# Patient Record
Sex: Male | Born: 1970 | Race: White | Hispanic: No | Marital: Single | Smoking: Never smoker
Health system: Southern US, Community
[De-identification: ages and names within clinical notes are randomized; demographics above are authoritative.]

## PROBLEM LIST (undated history)

## (undated) DIAGNOSIS — E7800 Pure hypercholesterolemia, unspecified: Secondary | ICD-10-CM

## (undated) DIAGNOSIS — I10 Essential (primary) hypertension: Secondary | ICD-10-CM

## (undated) HISTORY — PX: SKIN GRAFT: SHX250

---

## 2008-11-23 ENCOUNTER — Encounter: Admission: RE | Admit: 2008-11-23 | Discharge: 2008-11-23 | Payer: Self-pay | Admitting: Occupational Medicine

## 2009-04-15 ENCOUNTER — Emergency Department (HOSPITAL_COMMUNITY): Admission: EM | Admit: 2009-04-15 | Discharge: 2009-04-15 | Payer: Self-pay | Admitting: Emergency Medicine

## 2010-06-03 LAB — BASIC METABOLIC PANEL
Calcium: 9.3 mg/dL (ref 8.4–10.5)
Creatinine, Ser: 1.24 mg/dL (ref 0.4–1.5)
GFR calc non Af Amer: >60 mL/min (ref >60)
Glucose, Bld: 191 mg/dL — ABNORMAL HIGH (ref 70–99)
Sodium: 136 mEq/L (ref 135–145)

## 2010-06-03 LAB — DIFFERENTIAL
Basophils Relative: 0 % (ref 0–1)
Eosinophils Absolute: 0 10*3/uL (ref 0.0–0.7)
Lymphocytes Relative: 3 % — ABNORMAL LOW (ref 12–46)
Lymphs Abs: 0.3 10*3/uL — ABNORMAL LOW (ref 0.7–4.0)
Monocytes Relative: 3 % (ref 3–12)
Neutro Abs: 10.7 10*3/uL — ABNORMAL HIGH (ref 1.7–7.7)

## 2010-06-03 LAB — CBC
HCT: 44.9 % (ref 39.0–52.0)
Platelets: 128 10*3/uL — ABNORMAL LOW (ref 150–400)
WBC: 11.3 10*3/uL — ABNORMAL HIGH (ref 4.0–10.5)

## 2015-01-15 ENCOUNTER — Other Ambulatory Visit: Payer: Self-pay | Admitting: Physician Assistant

## 2015-01-15 DIAGNOSIS — R1084 Generalized abdominal pain: Secondary | ICD-10-CM

## 2015-01-22 ENCOUNTER — Ambulatory Visit
Admission: RE | Admit: 2015-01-22 | Discharge: 2015-01-22 | Disposition: A | Discharge status: Home / Self Care | Payer: BLUE CROSS/BLUE SHIELD | Source: Ambulatory Visit | Attending: Physician Assistant | Admitting: Physician Assistant

## 2015-01-22 DIAGNOSIS — R1084 Generalized abdominal pain: Secondary | ICD-10-CM

## 2019-08-24 ENCOUNTER — Other Ambulatory Visit: Payer: Self-pay

## 2019-08-24 ENCOUNTER — Ambulatory Visit: Payer: BLUE CROSS/BLUE SHIELD

## 2019-08-24 ENCOUNTER — Ambulatory Visit
Admission: EM | Admit: 2019-08-24 | Discharge: 2019-08-24 | Disposition: A | Discharge status: Home / Self Care | Payer: Commercial Managed Care - PPO

## 2019-08-24 ENCOUNTER — Ambulatory Visit (INDEPENDENT_AMBULATORY_CARE_PROVIDER_SITE_OTHER): Payer: Commercial Managed Care - PPO

## 2019-08-24 DIAGNOSIS — M25442 Effusion, left hand: Secondary | ICD-10-CM | POA: Diagnosis not present

## 2019-08-24 DIAGNOSIS — D1612 Benign neoplasm of short bones of left upper limb: Secondary | ICD-10-CM | POA: Diagnosis not present

## 2019-08-24 DIAGNOSIS — S6992XA Unspecified injury of left wrist, hand and finger(s), initial encounter: Secondary | ICD-10-CM | POA: Diagnosis not present

## 2019-08-24 DIAGNOSIS — M79645 Pain in left finger(s): Secondary | ICD-10-CM | POA: Diagnosis not present

## 2019-08-24 HISTORY — DX: Essential (primary) hypertension: I10

## 2019-08-24 HISTORY — DX: Pure hypercholesterolemia, unspecified: E78.00

## 2019-08-24 MED ORDER — PREDNISONE 10 MG PO TABS: 20.0000 mg | ORAL_TABLET | Freq: Every day | ORAL | 0 refills | Status: AC

## 2019-08-24 MED ORDER — ACETAMINOPHEN 500 MG PO TABS: 500.0000 mg | ORAL_TABLET | Freq: Four times a day (QID) | ORAL | 0 refills | Status: AC | PRN

## 2019-08-24 NOTE — ED Provider Notes (Signed)
[facility]    CSN: [id number] Arrival date & time: 08/24/19  5053      History   Chief Complaint Chief Complaint  Patient presents with  . Finger Injury    HPI Mark Foley is a 49 y.o. male.   Who presented to the urgent care with a  complaint of left index finger pain that occurred today.  Reported he jammed his index finger into the wall.  Localized pain to the metacarpal joint.  Has not tried any OTC medication.  Denies similar symptoms in the past.  Symptoms are made worse with range of motion.  Denies chills, fever, nausea, vomiting, diarrhea.    The history is provided by the patient. No language interpreter was used.    Past Medical History:  Diagnosis Date  . Elevated cholesterol   . Hypertension     There are no problems to display for this patient.   History reviewed. No pertinent surgical history.     Home Medications    Prior to Admission medications   Medication Sig Start Date End Date Taking? Authorizing Provider  losartan (COZAAR) 50 MG tablet Take 50 mg by mouth daily.   Yes [provider]  simvastatin (ZOCOR) 20 MG tablet Take 20 mg by mouth daily.   Yes [provider]  acetaminophen (TYLENOL) 500 MG tablet Take 1 tablet (500 mg total) by mouth every 6 (six) hours as needed. 08/24/19   Natalija Mavis, Darrelyn Hillock, FNP  predniSONE (DELTASONE) 10 MG tablet Take 2 tablets (20 mg total) by mouth daily. 08/24/19   Yarissa Reining, Darrelyn Hillock, FNP    Family History History reviewed. No pertinent family history.  Social History Social History   Tobacco Use  . Smoking status: Not on file  Substance Use Topics  . Alcohol use: Not on file  . Drug use: Not on file     Allergies   Ibuprofen   Review of Systems Review of Systems  Constitutional: Negative.   Respiratory: Negative.   Cardiovascular: Negative.   Musculoskeletal: Positive for arthralgias.  All other systems reviewed and are negative.    Physical Exam Triage  Vital Signs ED Triage Vitals  Enc Vitals Group     BP 08/24/19 0947 (!) 174/103     Pulse Rate 08/24/19 0947 83     Resp 08/24/19 0947 16     Temp 08/24/19 0947 98 F (36.7 C)     Temp src --      SpO2 08/24/19 0947 98 %     Weight --      Height --      Head Circumference --      Peak Flow --      Pain Score 08/24/19 0950 5     Pain Loc --      Pain Edu? --      Excl. in [place]? --    No data found.  Updated Vital Signs BP (!) 174/103   Pulse 83   Temp 98 F (36.7 C)   Resp 16   SpO2 98%   Visual Acuity Right Eye Distance:   Left Eye Distance:   Bilateral Distance:    Right Eye Near:   Left Eye Near:    Bilateral Near:     Physical Exam Vitals and nursing note reviewed.  Constitutional:      General: He is not in acute distress.    Appearance: Normal appearance. He is normal weight. He is not ill-appearing, toxic-appearing or  diaphoretic.  Cardiovascular:     Rate and Rhythm: Normal rate and regular rhythm.     Pulses: Normal pulses.     Heart sounds: Normal heart sounds. No murmur. No friction rub. No gallop.   Pulmonary:     Effort: Pulmonary effort is normal. No respiratory distress.     Breath sounds: Normal breath sounds. No stridor. No wheezing, rhonchi or rales.  Chest:     Chest wall: No tenderness.  Musculoskeletal:        General: Tenderness present.     Right hand: Normal.     Left hand: Swelling and tenderness present.     Comments: Left hand is with wrist deformity when compared to the right hand.  Swelling present.  No erythema, atrophy, or obvious warmth, subungual hematoma, partial or complete amputation, avulsion, open wound.  Normal cascade of finger.  No focal or throbbing pain.  Pulse and capillary refill intact.  Neurological:     Mental Status: He is alert.      UC Treatments / Results  Labs (all labs ordered are listed, but only abnormal results are displayed) Labs Reviewed - No data to display  EKG   Radiology DG Finger  Index Left  Result Date: 08/24/2019 CLINICAL DATA:  Pain and swelling in the index finger after jamming finger against door. Patient feels the finger came out of place at the DIP joint. EXAM: LEFT INDEX FINGER 2+V COMPARISON:  None. FINDINGS: The mineralization and alignment are normal at the index finger. The joint spaces are preserved. There is no evidence of acute fracture or dislocation. No focal soft tissue swelling is identified. There is a mixed lucent and sclerotic lesion involving the proximal diaphysis of the 3rd proximal phalanx which extends 2.1 cm in length. There is mild associated endosteal thinning, but no pathologic fracture. This has a nonaggressive appearance and most likely represents an enchondroma, although fibrous dysplasia is a consideration. IMPRESSION: 1. No acute osseous findings. 2. Nonaggressive appearing mixed lucent and sclerotic lesion in the 3rd proximal phalanx, likely an enchondroma. Electronically Signed   By: Richardean Sale M.D.   On: 08/24/2019 10:31    Procedures Procedures (including critical care time)  Medications Ordered in UC Medications - No data to display  Initial Impression / Assessment and Plan / UC Course  I have reviewed the triage vital signs and the nursing notes.  Pertinent labs & imaging results that were available during my care of the patient were reviewed by me and considered in my medical decision making (see chart for details).   Patient is stable at discharge.  Left index.X-ray is negative for bony abnormality including fracture or dislocation.  I have reviewed the x-ray myself and the radiologist interpretation.  I am in agreement with the radiologist interpretation.  Tylenol and prednisone were prescribed for symptomatic relief. Was advised to follow-up with PCP.  Final Clinical Impressions(s) / UC Diagnoses   Final diagnoses:  Finger pain, left  Enchondroma of finger, left     Discharge Instructions     Take medication  as prescribed and to completion Follow RICE instruction as attached Follow-up with PCP Return or go to ED for worsening of symptoms    ED Prescriptions    Medication Sig Dispense Auth. Provider   acetaminophen (TYLENOL) 500 MG tablet Take 1 tablet (500 mg total) by mouth every 6 (six) hours as needed. 30 tablet Madalyne Husk S, FNP   predniSONE (DELTASONE) 10 MG tablet Take 2 tablets (20  mg total) by mouth daily. 15 tablet Luv Mish, Darrelyn Hillock, FNP     PDMP not reviewed this encounter.   Emerson Monte, FNP 08/24/19 1050

## 2019-08-24 NOTE — ED Triage Notes (Signed)
Pt c/o pain to left index finger after jamming it into wall. [place] PTA

## 2019-08-24 NOTE — Discharge Instructions (Addendum)
Take medication as prescribed and to completion Follow RICE instruction as attached Follow-up with PCP Return or go to ED for worsening of symptoms

## 2019-12-29 ENCOUNTER — Other Ambulatory Visit: Payer: Self-pay

## 2019-12-29 ENCOUNTER — Other Ambulatory Visit: Payer: Self-pay | Admitting: Family Medicine

## 2019-12-29 ENCOUNTER — Ambulatory Visit
Admission: RE | Admit: 2019-12-29 | Discharge: 2019-12-29 | Disposition: A | Discharge status: Home / Self Care | Payer: Commercial Managed Care - PPO | Source: Ambulatory Visit | Attending: Family Medicine | Admitting: Family Medicine

## 2019-12-29 DIAGNOSIS — R52 Pain, unspecified: Secondary | ICD-10-CM

## 2020-05-21 ENCOUNTER — Emergency Department (HOSPITAL_COMMUNITY): Payer: Commercial Managed Care - PPO

## 2020-05-21 ENCOUNTER — Emergency Department (HOSPITAL_COMMUNITY)
Admission: EM | Admit: 2020-05-21 | Discharge: 2020-05-21 | Disposition: A | Discharge status: Home / Self Care | Payer: Commercial Managed Care - PPO | Attending: Emergency Medicine | Admitting: Emergency Medicine

## 2020-05-21 ENCOUNTER — Encounter (HOSPITAL_COMMUNITY): Payer: Self-pay

## 2020-05-21 ENCOUNTER — Other Ambulatory Visit: Payer: Self-pay

## 2020-05-21 DIAGNOSIS — I10 Essential (primary) hypertension: Secondary | ICD-10-CM | POA: Insufficient documentation

## 2020-05-21 DIAGNOSIS — S0990XA Unspecified injury of head, initial encounter: Secondary | ICD-10-CM

## 2020-05-21 DIAGNOSIS — Z79899 Other long term (current) drug therapy: Secondary | ICD-10-CM | POA: Insufficient documentation

## 2020-05-21 DIAGNOSIS — R630 Anorexia: Secondary | ICD-10-CM | POA: Diagnosis not present

## 2020-05-21 DIAGNOSIS — W2100XA Struck by hit or thrown ball, unspecified type, initial encounter: Secondary | ICD-10-CM | POA: Diagnosis not present

## 2020-05-21 DIAGNOSIS — S060X0A Concussion without loss of consciousness, initial encounter: Secondary | ICD-10-CM | POA: Diagnosis not present

## 2020-05-21 DIAGNOSIS — Y9364 Activity, baseball: Secondary | ICD-10-CM | POA: Insufficient documentation

## 2020-05-21 LAB — CBG MONITORING, ED: Glucose-Capillary: 80 mg/dL (ref 70–99)

## 2020-05-21 NOTE — ED Triage Notes (Signed)
Pt hit in right side of head with baseball Wednesday, been having difficulty sleeping, decreased appetite, foggy headed since.

## 2020-05-21 NOTE — Discharge Instructions (Signed)
You were examined today for a head injury and possible concussion.  Your head CT showed no evidence of  Injury today.   Sometimes serious problems can develop after a head injury. Please return to the emergency department if you experience any of the following symptoms: Repeated vomiting Headache that gets worse and does not go away Loss of consciousness or inability to stay awake at times when you   normally would be able to Getting more confused, restless or agitated Convulsions or seizures Difficulty walking or feeling off balance Weakness or numbness Vision changes A concussion is a very mild traumatic brain injury caused by a bump, jolt or blow to the head, most people recover quickly and fully. You can experience a wide variety of symptoms including:   - Confusion      - Difficulty concentrating       - Trouble remembering new info  - Headache      - Dizziness        - Fuzzy or blurry vision  - Fatigue      - Balance problems      - Light sensitivity  - Mood swings     - Changes in sleep or difficulty sleeping   To help these symptoms improve make sure you are getting plenty of rest, avoid screen time, loud music and strenuous mental activities. Avoid any strenuous physical activities, once your symptoms have resolved a slow and gradual return to activity is recommended. It is very important that you avoid situations in which you might sustain a second head injury as this can be very dangerous and life threatening. You cannot be medically cleared to return to normal activities until you have followed up with your primary doctor or a concussion specialist for reevaluation.

## 2020-05-21 NOTE — ED Provider Notes (Signed)
[facility]   CSN: [id number] Arrival date & time: 05/21/20  0901     History Chief Complaint  Patient presents with  . Head Injury    CORLEY MAFFEO is a 50 y.o. male.  OREE HISLOP is a 50 y.o. male with a history of hypertension and hyperlipidemia, who presents to the ED for evaluation of head injury.  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  He reports that initially made him feel a bit woozy but he did not lose consciousness.  Denies initial headache, vision changes, nausea, vomiting or dizziness.  He continued to coach the game, but since then has had difficulty sleeping and often times has not been able to stay asleep for more than 2 to 3 hours.  Reports he has had decreased appetite and has been very foggy headed and felt like he cannot think straight.  He has had occasional headaches.  No numbness, weakness or tingling.  He was not evaluated after initial head injury.  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  Symptoms persisted today and his coworker encouraged him to come and get evaluated. Reports he felt tremulous today but didn't know if that could be from not eating much Reports remote history of prior concussion.  No other injuries.  No meds prior to arrival.  No other aggravating or alleviating factors.        Past Medical History:  Diagnosis Date  . Elevated cholesterol   . Hypertension     There are no problems to display for this patient.   Past Surgical History:  Procedure Laterality Date  . SKIN GRAFT         No family history on file.  Social History   Tobacco Use  . Smoking  status: Never Smoker  . Smokeless tobacco: Never Used  Substance Use Topics  . Alcohol use: Yes    Comment: occ  . Drug use: Never    Home Medications Prior to Admission medications   Medication Sig Start Date End Date Taking? Authorizing Provider  acetaminophen (TYLENOL) 500 MG tablet Take 1 tablet (500 mg total) by mouth every 6 (six) hours as needed. 08/24/19   Avegno, Darrelyn Hillock, FNP  losartan (COZAAR) 50 MG tablet Take 50 mg by mouth daily.    [provider]  predniSONE (DELTASONE) 10 MG tablet Take 2 tablets (20 mg total) by mouth daily. 08/24/19   Avegno, Darrelyn Hillock, FNP  simvastatin (ZOCOR) 20 MG tablet Take 20 mg by mouth daily.    [provider]    Allergies    Ibuprofen  Review of Systems   Review of Systems  Constitutional: Positive for fatigue. Negative for chills and fever.  HENT: Negative for congestion.   Eyes: Positive for photophobia. Negative for pain, discharge, itching and visual disturbance.  Respiratory: Negative for cough and shortness of breath.   Cardiovascular: Negative for chest pain.  Gastrointestinal: Negative for nausea and vomiting.  Musculoskeletal: Negative for arthralgias, back pain, myalgias and neck pain.  Skin: Negative for color change and rash.  Neurological: Positive for light-headedness  and headaches. Negative for dizziness, tremors, seizures, syncope, speech difficulty, weakness and numbness.  Psychiatric/Behavioral: Positive for confusion and sleep disturbance.  All other systems reviewed and are negative.   Physical Exam Updated Vital Signs BP (!) 153/108 (BP Location: Left Arm)   Pulse (!) 102   Temp 98.4 F (36.9 C) (Oral)   Resp 18   Ht 6\' 1"  (1.854 m)   Wt 95.3 kg   SpO2 99%   BMI 27.71 kg/m   Physical Exam Vitals and nursing note reviewed.  Constitutional:      General: He is not in acute distress.    Appearance: Normal appearance. He is well-developed and well-nourished. He is not ill-appearing or  diaphoretic.  HENT:     Head: Normocephalic.     Comments: Mild tenderness over the right temple, no overlying hematoma, or bruising, negative battle sign, no CSF otorrhea    Mouth/Throat:     Mouth: Mucous membranes are moist.     Pharynx: Oropharynx is clear.  Eyes:     General:        Right eye: No discharge.        Left eye: No discharge.     Extraocular Movements: Extraocular movements intact.     Pupils: Pupils are equal, round, and reactive to light.  Cardiovascular:     Rate and Rhythm: Normal rate and regular rhythm.     Heart sounds: Normal heart sounds.  Pulmonary:     Effort: Pulmonary effort is normal. No respiratory distress.     Breath sounds: Normal breath sounds.  Abdominal:     General: There is no distension.     Palpations: Abdomen is soft. There is no mass.     Tenderness: There is no abdominal tenderness. There is no guarding.  Musculoskeletal:        General: No tenderness or deformity.     Cervical back: Normal range of motion and neck supple. No tenderness.  Skin:    General: Skin is warm and dry.  Neurological:     Mental Status: He is alert and oriented to person, place, and time.     Coordination: Coordination normal.     Comments: Speech is clear, able to follow commands CN III-XII intact Normal strength in upper and lower extremities bilaterally including dorsiflexion and plantar flexion, strong and equal grip strength Sensation normal to light and sharp touch Moves extremities without ataxia, coordination intact No pronator drift  Psychiatric:        Mood and Affect: Mood and affect and mood normal.        Behavior: Behavior normal.     ED Results / Procedures / Treatments   Labs (all labs ordered are listed, but only abnormal results are displayed) Labs Reviewed  CBG MONITORING, ED    EKG None  Radiology CT Head Wo Contrast  Result Date: 05/21/2020 CLINICAL DATA:  Head trauma, moderate to severe. Head injury 5 days ago. EXAM: CT  HEAD WITHOUT CONTRAST TECHNIQUE: Contiguous axial images were obtained from the base of the skull through the vertex without intravenous contrast. COMPARISON:  09/13/2009 FINDINGS: Brain: No evidence of acute infarction, hemorrhage, hydrocephalus, extra-axial collection or mass lesion/mass effect. Vascular: No hyperdense vessel or unexpected calcification. Skull: Normal. Negative for fracture or focal lesion. Sinuses/Orbits: No acute finding. IMPRESSION: Negative head CT. Electronically Signed   By: Monte Fantasia M.D.   On: 05/21/2020 10:04    Procedures Procedures   Medications Ordered in ED Medications - No  data to display  ED Course  I have reviewed the triage vital signs and the nursing notes.  Pertinent labs & imaging results that were available during my care of the patient were reviewed by me and considered in my medical decision making (see chart for details).    MDM Rules/Calculators/A&P                         50 year old male presents with head injury 6 days ago, no LOC, severe headache, vision changes or vomiting immediately after the head injury the patient has continued to have sleep disturbance, poor appetite, and feels foggy headed.  Strong clinical suspicion for concussion.  Patient with no focal neurologic deficits.  Reports he has felt a bit tremulous and has had decreased appetite, questions whether he may have low sugar.  Will get head CT and CBG.  Patient CBG is appropriate at 80.  Head CT reviewed, agree with radiologist findings, no acute intracranial abnormalities or evidence of skull fracture.  Patient with continued concussion symptoms, but has not been resting over the past 6 days and has continued to be active and working.  Stressed the importance of have a second head injury.  Discussed outpatient follow-up with neurology or concussion specialist as well as PCP follow-up prior to returning to activity.  Patient expresses understanding and agreement with plan.   Discharged home in good condition  Final Clinical Impression(s) / ED Diagnoses Final diagnoses:  Injury of head, initial encounter  Concussion without loss of consciousness, initial encounter    Rx / DC Orders ED Discharge Orders    None       Janet Berlin 05/21/20 1805    Wyvonnia Dusky, MD 05/21/20 1836

## 2021-11-24 IMAGING — CT CT HEAD W/O CM
3 series · 16 of 47 positions shown, 19 images · non-contrast
Comparison: 09/13/2009

CLINICAL DATA: Head trauma, moderate to severe. Head injury 5 days
ago.

EXAM:
CT HEAD WITHOUT CONTRAST
TECHNIQUE: Contiguous axial images were obtained from the base of the skull
through the vertex without intravenous contrast.

[Series 2: head w o · axial · 0.45mm/px · z∈[+29,+164]mm · 10 of 33 slices shown, 13 images]
[im 3/33  brain]
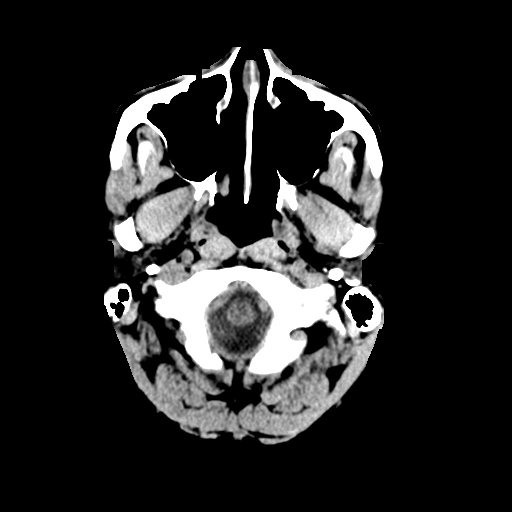
[im 3/33  bone]
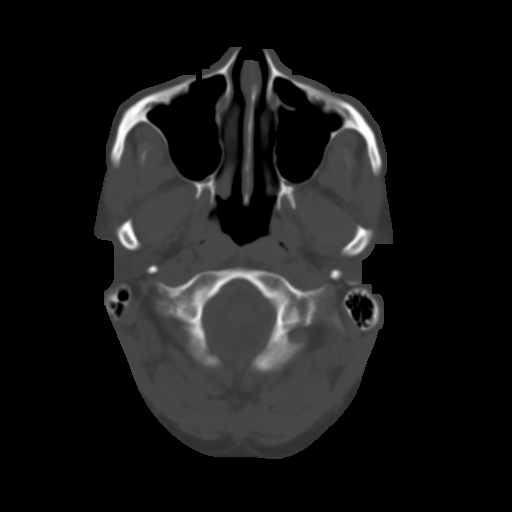
[im 6/33  brain]
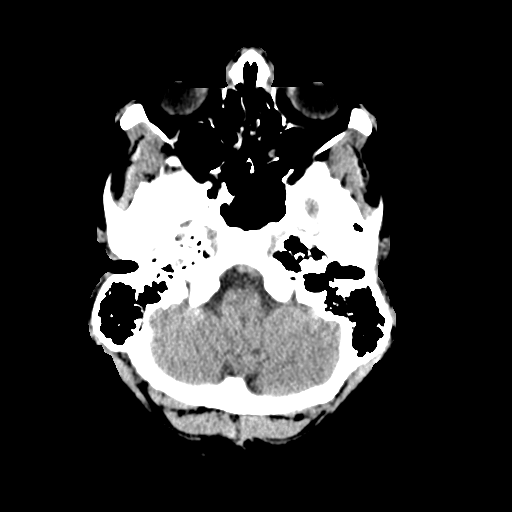
[im 9/33  brain]
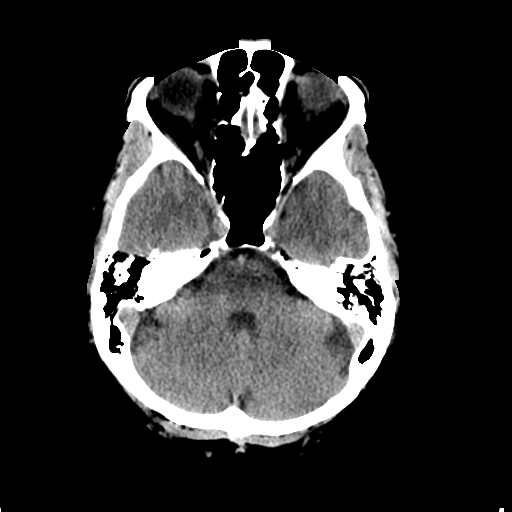
[im 12/33  brain]
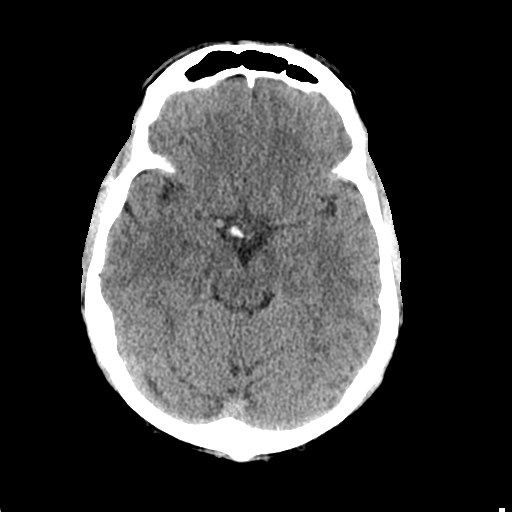
[im 15/33  brain]
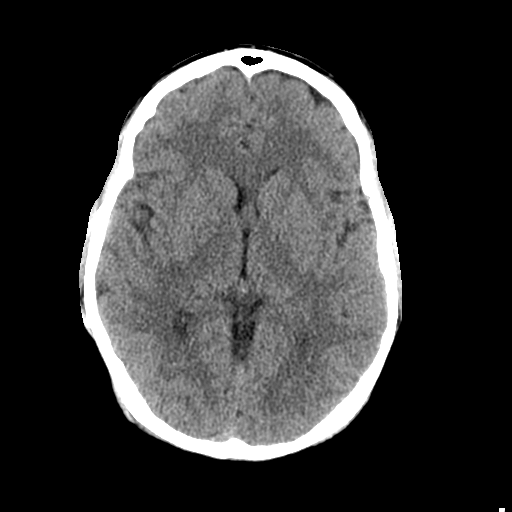
[im 15/33  bone]
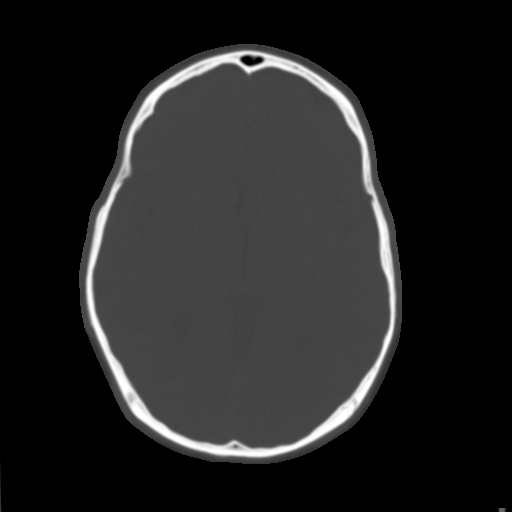
[im 18/33  brain]
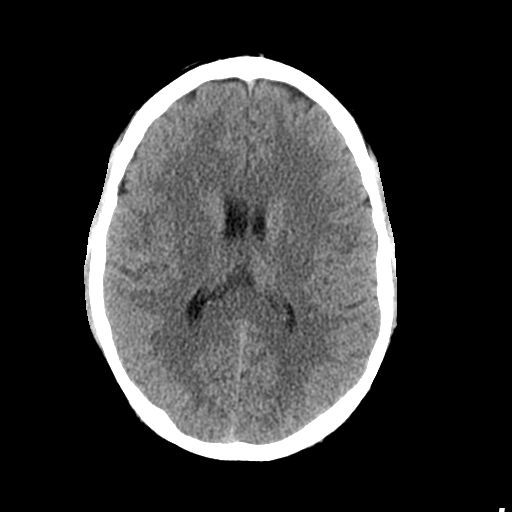
[im 21/33  brain]
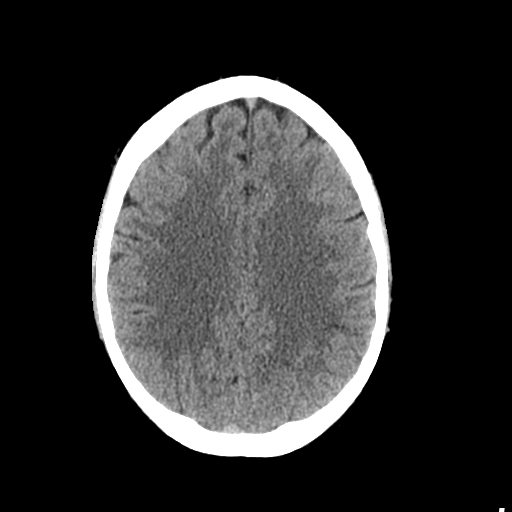
[im 25/33  brain]
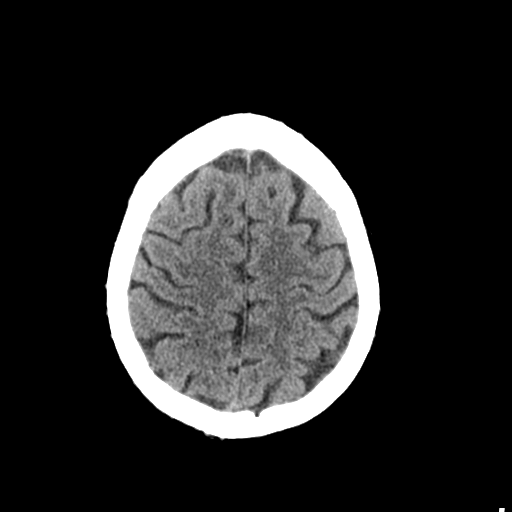
[im 27/33  brain]
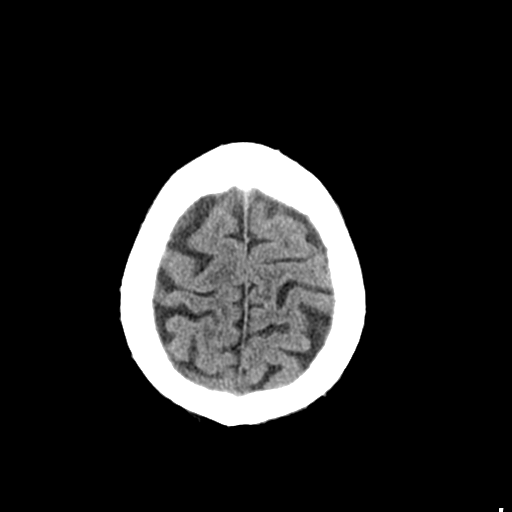
[im 27/33  bone]
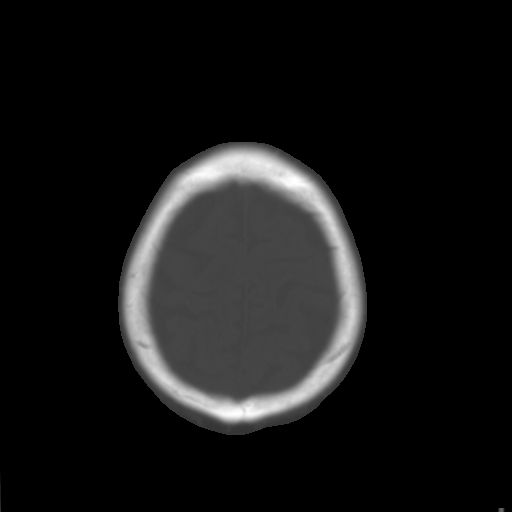
[im 30/33  brain]
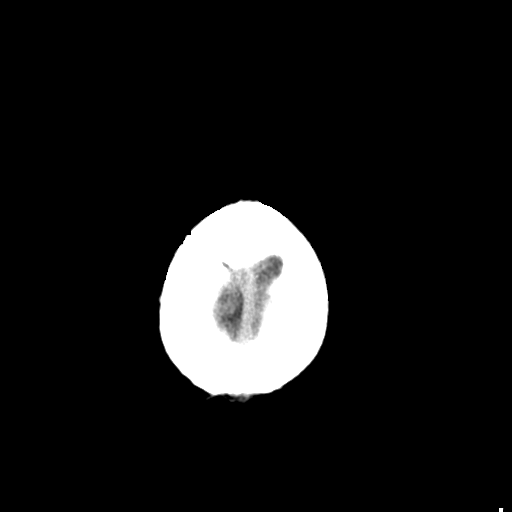

[Series 4: coronal soft · coronal · 0.32mm/px · 3 of 71 slices shown]
[im 24/71  brain]
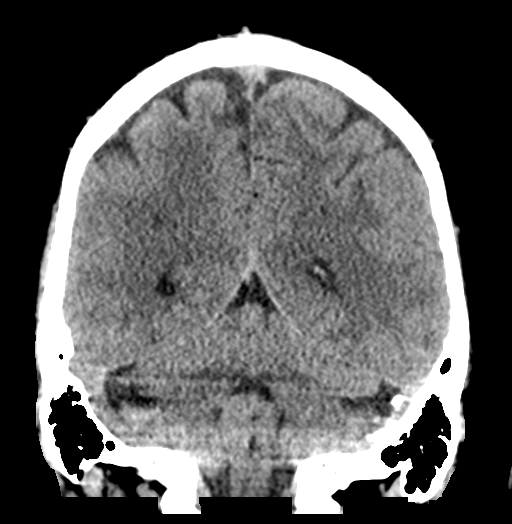
[im 32/71  brain]
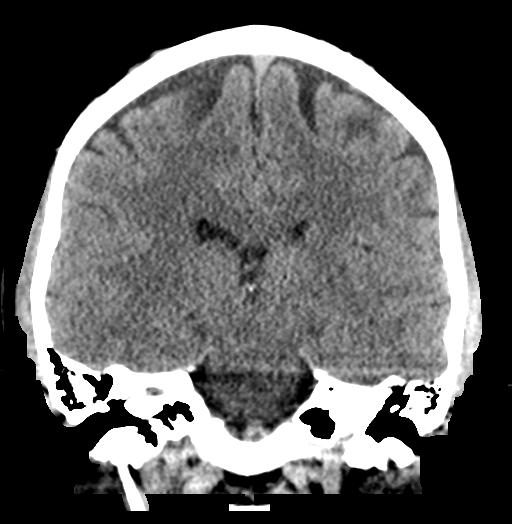
[im 39/71  brain]
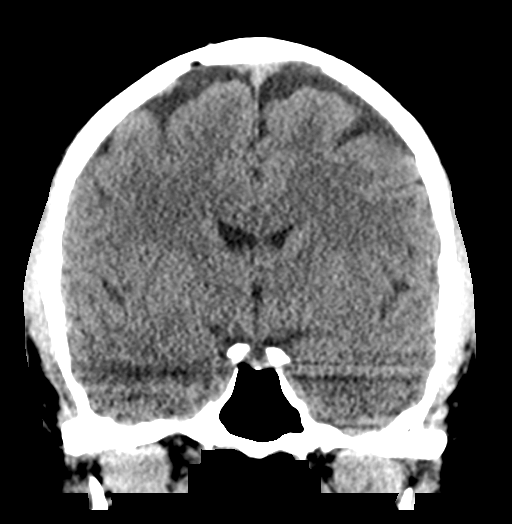

[Series 5: sagittal soft · sagittal · 0.33mm/px · 3 of 55 slices shown]
[im 19/55  brain]
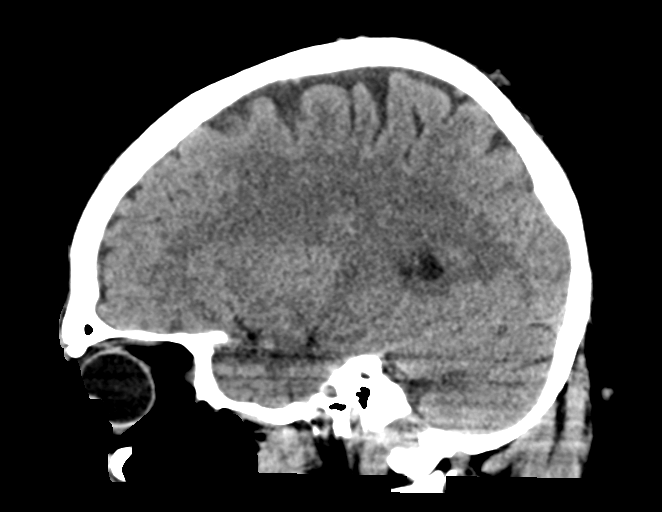
[im 28/55  brain]
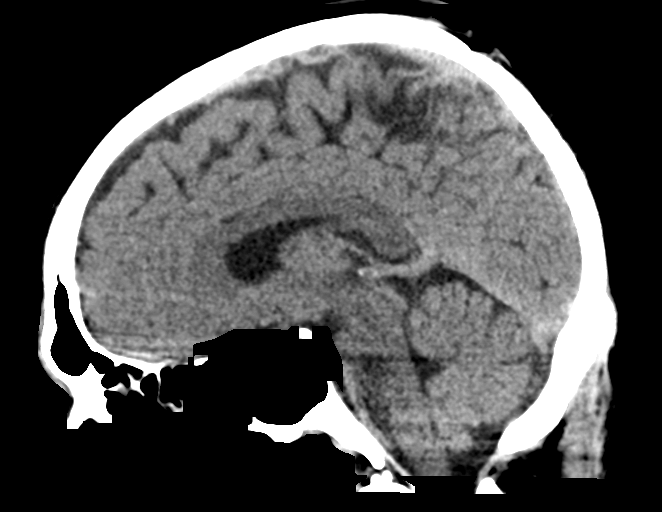
[im 37/55  brain]
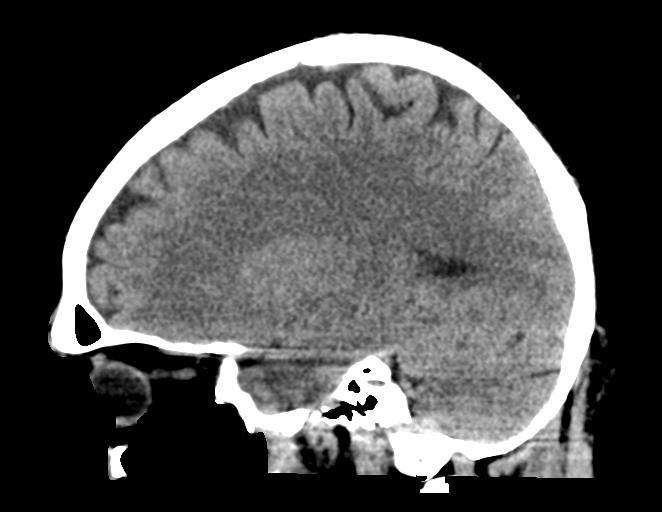

[16 of 47 positions shown; findings below may reference images not displayed]

FINDINGS: Brain: No evidence of acute infarction, hemorrhage, hydrocephalus,
extra-axial collection or mass lesion/mass effect.

Vascular: No hyperdense vessel or unexpected calcification.

Skull: Normal. Negative for fracture or focal lesion.

Sinuses/Orbits: No acute finding.
IMPRESSION: Negative head CT.
